# Patient Record
Sex: Female | Born: 1995 | Race: White | Hispanic: No | Marital: Single
Health system: Southern US, Community
[De-identification: ages and names within clinical notes are randomized; demographics above are authoritative.]

---

## 2019-06-04 ENCOUNTER — Other Ambulatory Visit: Payer: Self-pay | Admitting: Specialist

## 2019-06-04 DIAGNOSIS — N63 Unspecified lump in unspecified breast: Secondary | ICD-10-CM

## 2019-06-21 ENCOUNTER — Other Ambulatory Visit: Payer: Self-pay

## 2019-06-21 ENCOUNTER — Ambulatory Visit
Admission: RE | Admit: 2019-06-21 | Discharge: 2019-06-21 | Disposition: A | Discharge status: Home / Self Care | Payer: Managed Care, Other (non HMO) | Source: Ambulatory Visit | Attending: Specialist | Admitting: Specialist

## 2019-06-21 ENCOUNTER — Other Ambulatory Visit: Payer: Self-pay | Admitting: Specialist

## 2019-06-21 DIAGNOSIS — N63 Unspecified lump in unspecified breast: Secondary | ICD-10-CM

## 2019-12-21 ENCOUNTER — Inpatient Hospital Stay: Admission: RE | Admit: 2019-12-21 | Payer: Managed Care, Other (non HMO) | Source: Ambulatory Visit

## 2020-02-23 ENCOUNTER — Ambulatory Visit (INDEPENDENT_AMBULATORY_CARE_PROVIDER_SITE_OTHER): Payer: Managed Care, Other (non HMO) | Admitting: Podiatry

## 2020-02-23 ENCOUNTER — Encounter: Payer: Self-pay | Admitting: Podiatry

## 2020-02-23 ENCOUNTER — Other Ambulatory Visit: Payer: Self-pay

## 2020-02-23 ENCOUNTER — Ambulatory Visit (INDEPENDENT_AMBULATORY_CARE_PROVIDER_SITE_OTHER): Payer: Managed Care, Other (non HMO)

## 2020-02-23 DIAGNOSIS — M6740 Ganglion, unspecified site: Secondary | ICD-10-CM

## 2020-02-23 NOTE — Progress Notes (Signed)
   HPI: 24 y.o. female presenting today as a new patient for evaluation of a bump that developed to the outside of the patient's left foot.  Patient states that is been present for approximately 3 weeks.  She denies any injury to the area.  It is only tender with pressure and in certain shoes.  She has not done anything for treatment.  She presents for further treatment and evaluation  History reviewed. No pertinent past medical history.   Physical Exam: General: The patient is alert and oriented x3 in no acute distress.  Dermatology: Skin is warm, dry and supple bilateral lower extremities. Negative for open lesions or macerations.  Vascular: Palpable pedal pulses bilaterally. No edema or erythema noted. Capillary refill within normal limits.  Neurological: Epicritic and protective threshold grossly intact bilaterally.   Musculoskeletal Exam: Range of motion within normal limits to all pedal and ankle joints bilateral. Muscle strength 5/5 in all groups bilateral.  There is a nonadherent fluctuant mass noted to the lateral aspect of the left forefoot consistent with findings of a ganglion cyst  Assessment: 1.  Ganglion cyst left foot   Plan of Care:  1. Patient evaluated. 2.  Direct pressure was applied to the ganglion cyst and the cyst was ruptured and the fluid was resorbed into the soft tissue. 3.  Recommend pressure and massage daily to the area to prevent recurrence 4.  Return to clinic as needed      Felecia Shelling, DPM [facility]  Dr. Felecia Shelling, DPM    [address] [place], [place] [address]                Office [phone]  Fax [phone]

## 2021-03-08 IMAGING — US US BREAST*L* LIMITED INC AXILLA
1 series · 7 of 7 positions shown · non-contrast
Comparison: None.

CLINICAL DATA: 23-year-old female complaining of a palpable
abnormality in the left breast. Patient states she initially felt
the mass in Friday December, 2018. She states that it is clinically stable.

EXAM:
ULTRASOUND OF THE LEFT BREAST

[Series 1: us breast*left* limited inc axilla · 0.06mm/px · 7 of 7 slices shown]
[im 1/7]
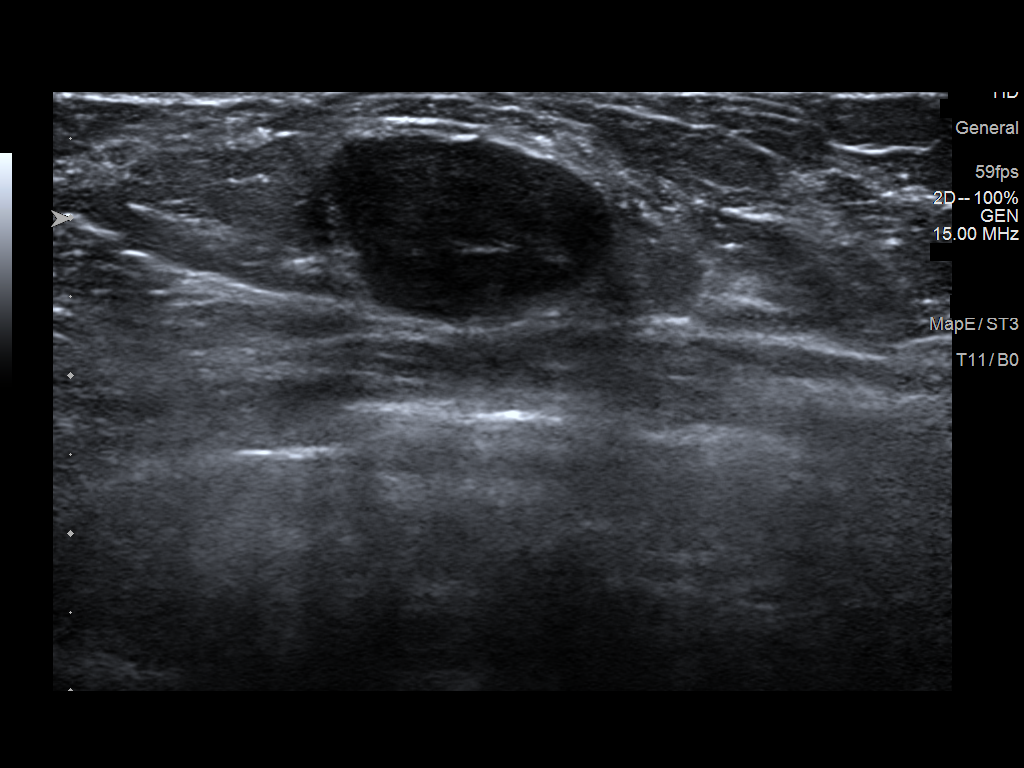
[im 2/7]
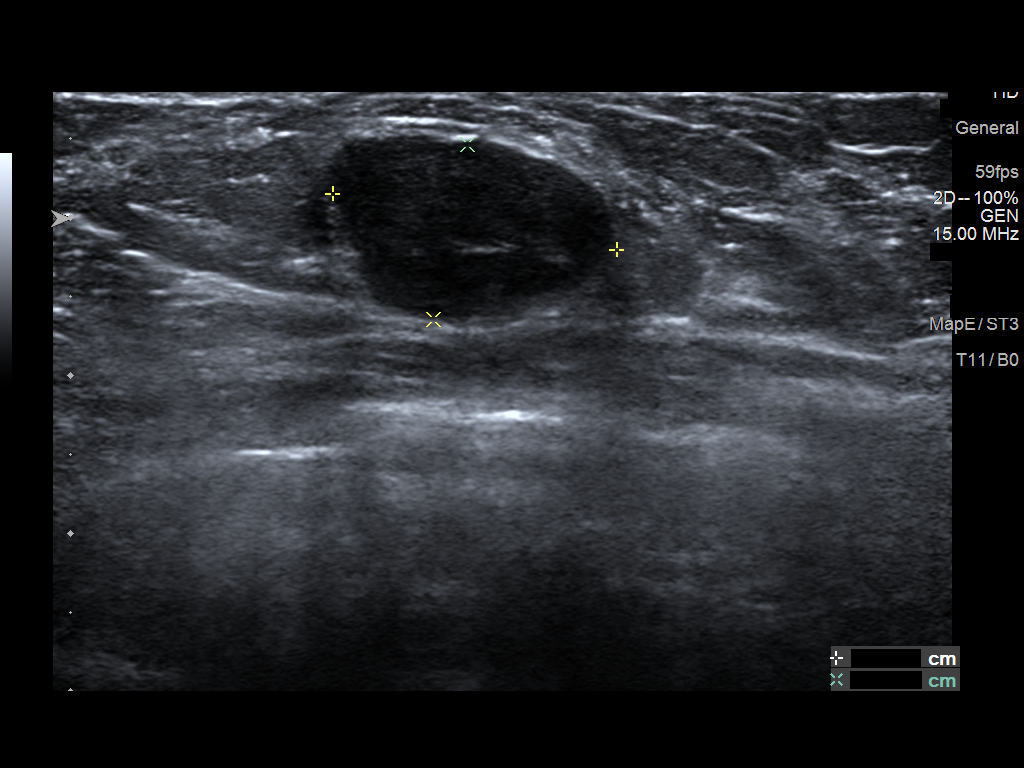
[im 3/7]
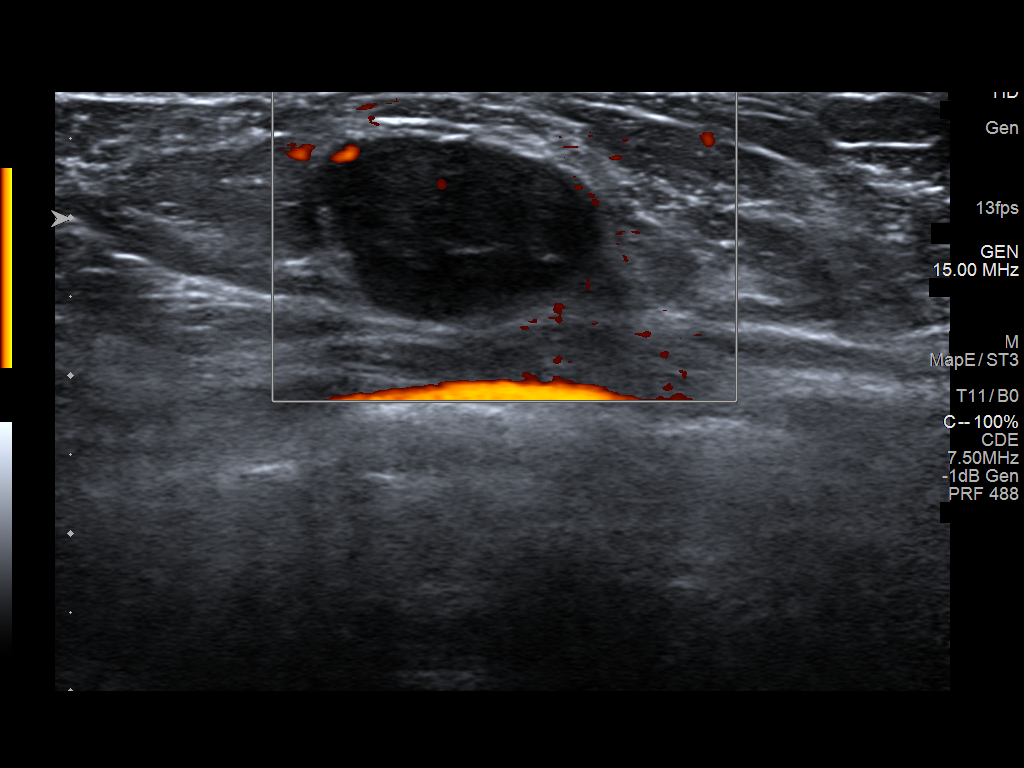
[im 4/7]
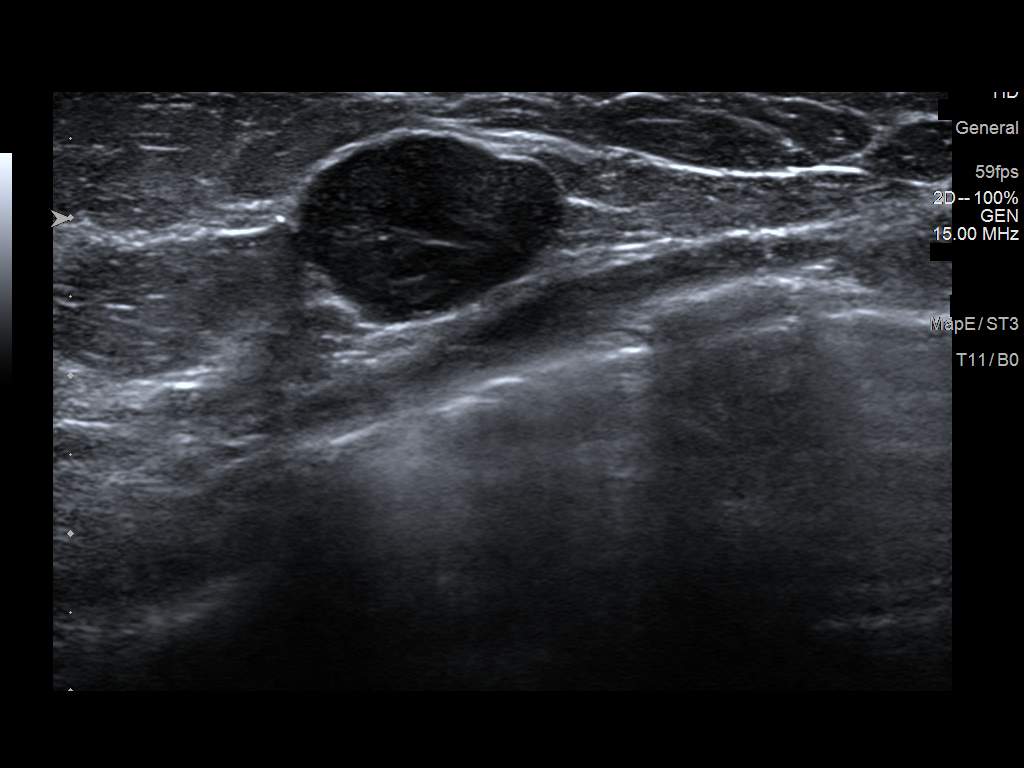
[im 5/7]
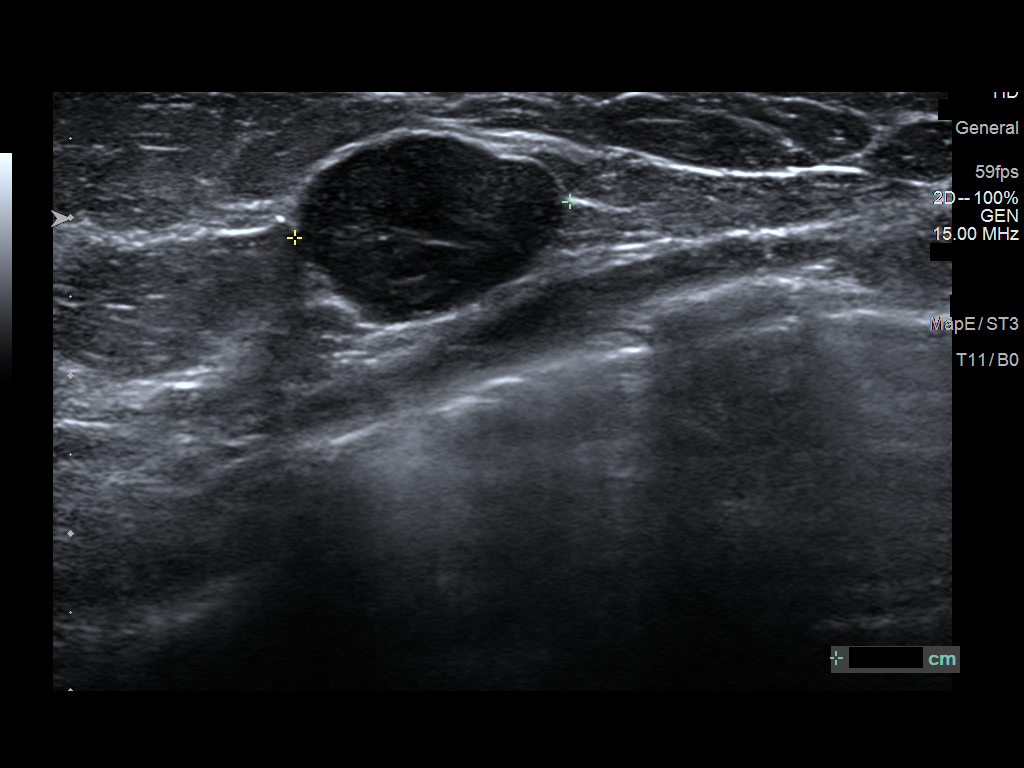
[im 6/7]
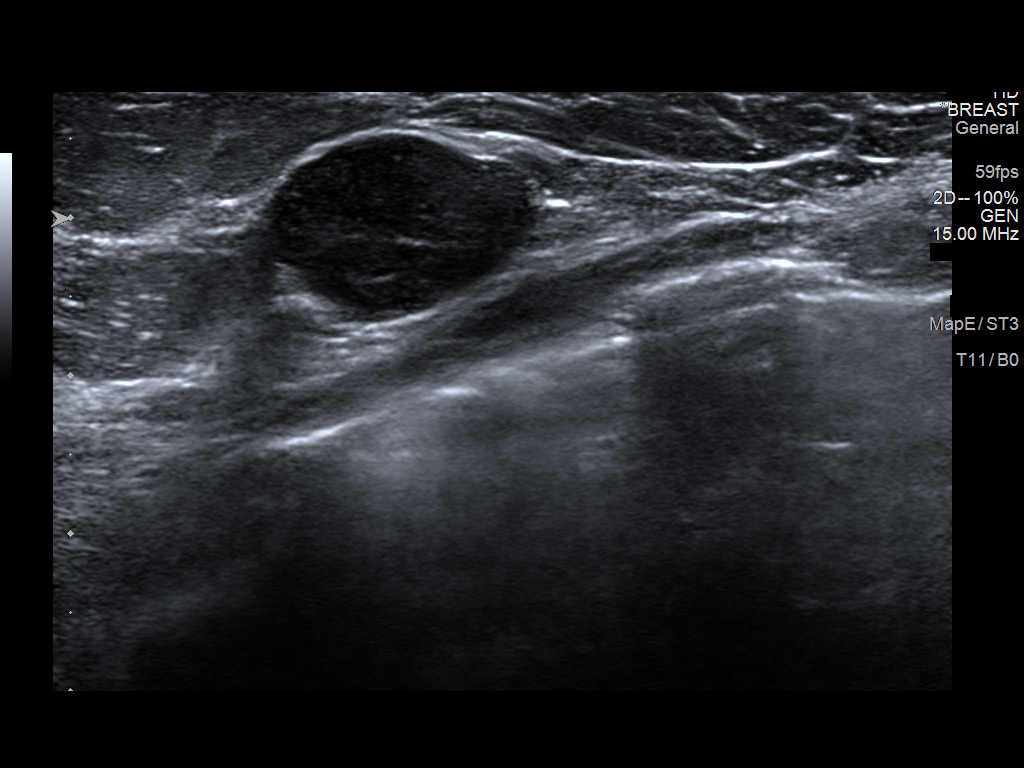
[im 7/7]
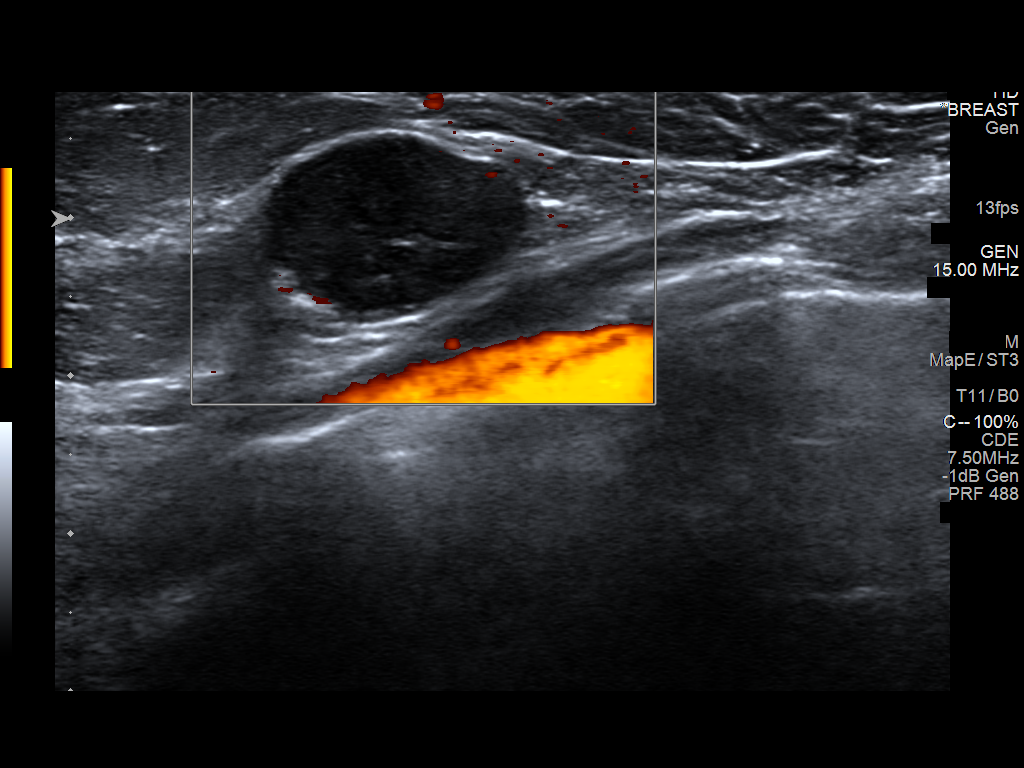

[7 of 7 positions shown; findings below may reference images not displayed]

FINDINGS: On physical exam, I palpate a discrete, freely mobile mass in left
breast at 4 o'clock 7 cm from the nipple.

Targeted ultrasound is performed, showing a well-circumscribed
hypoechoic mass with increased through transmission in the left
breast at 4 o'clock 7 cm from the nipple measuring 1.8 x 1.1 x
cm.
IMPRESSION: Probable fibroadenoma in the left breast.

RECOMMENDATION:
Short-term interval follow-up ultrasound in 6 months,
ultrasound-guided core biopsy and surgical excision were discussed
with the patient. The patient feels comfortable with a short-term
interval follow-up left breast ultrasound in 6 months. The
importance of self-breast examination was discussed with the
patient.

I have discussed the findings and recommendations with the patient.
If applicable, a reminder letter will be sent to the patient
regarding the next appointment.

BI-RADS CATEGORY  3: Probably benign.

## 2021-09-12 ENCOUNTER — Other Ambulatory Visit: Payer: Self-pay | Admitting: Obstetrics and Gynecology

## 2021-09-12 ENCOUNTER — Other Ambulatory Visit: Payer: Self-pay | Admitting: Physical Medicine and Rehabilitation

## 2021-09-12 DIAGNOSIS — N63 Unspecified lump in unspecified breast: Secondary | ICD-10-CM

## 2021-10-02 ENCOUNTER — Other Ambulatory Visit: Payer: Managed Care, Other (non HMO)

## 2021-10-15 ENCOUNTER — Other Ambulatory Visit: Payer: Managed Care, Other (non HMO)

## 2021-11-06 ENCOUNTER — Ambulatory Visit
Admission: RE | Admit: 2021-11-06 | Discharge: 2021-11-06 | Disposition: A | Discharge status: Home / Self Care | Payer: BC Managed Care – PPO | Source: Ambulatory Visit | Attending: Obstetrics and Gynecology | Admitting: Obstetrics and Gynecology

## 2021-11-06 DIAGNOSIS — N63 Unspecified lump in unspecified breast: Secondary | ICD-10-CM

## 2023-07-25 IMAGING — US US BREAST*L* LIMITED INC AXILLA
1 series · 5 of 5 positions shown · non-contrast
Comparison: Prior films

CLINICAL DATA: Short-term follow-up left breast mass

EXAM:
ULTRASOUND OF THE left BREAST

[Series 1: us breast*left* limited inc axilla · 0.07mm/px · 5 of 5 slices shown]
[im 1/5]
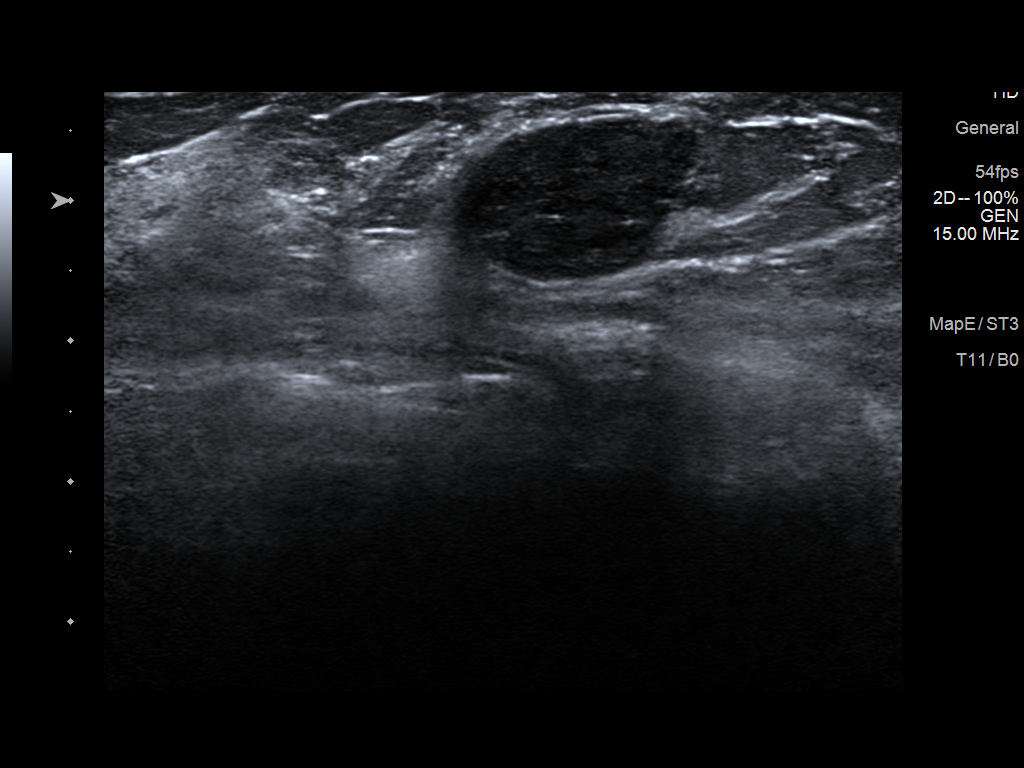
[im 2/5]
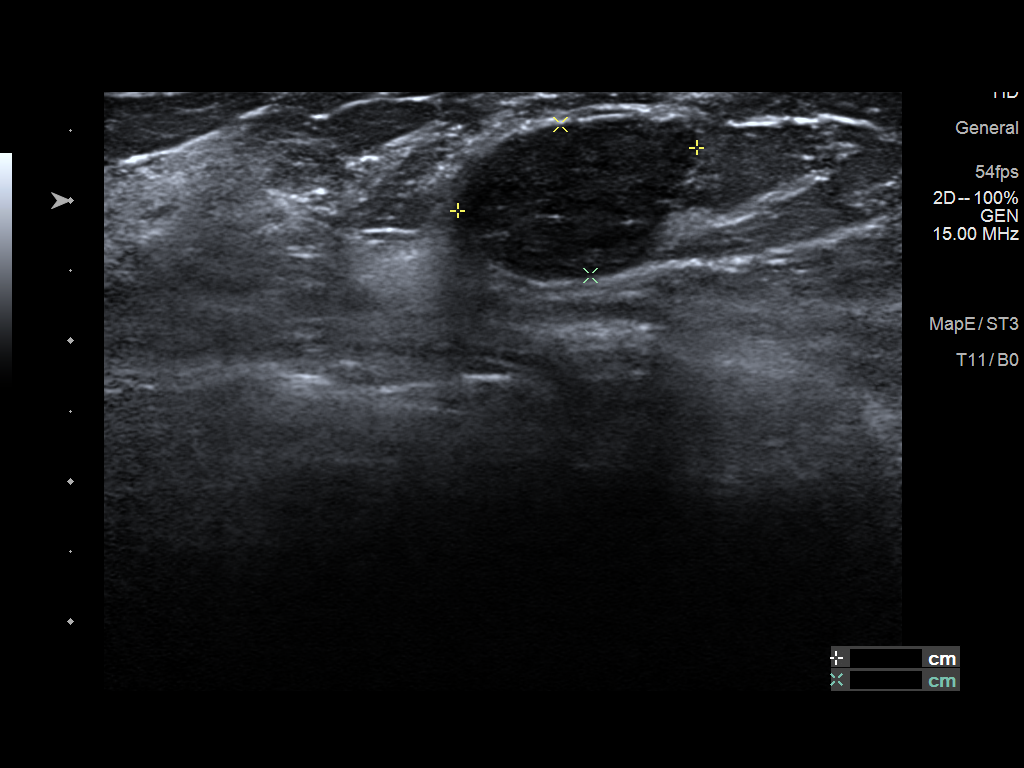
[im 3/5]
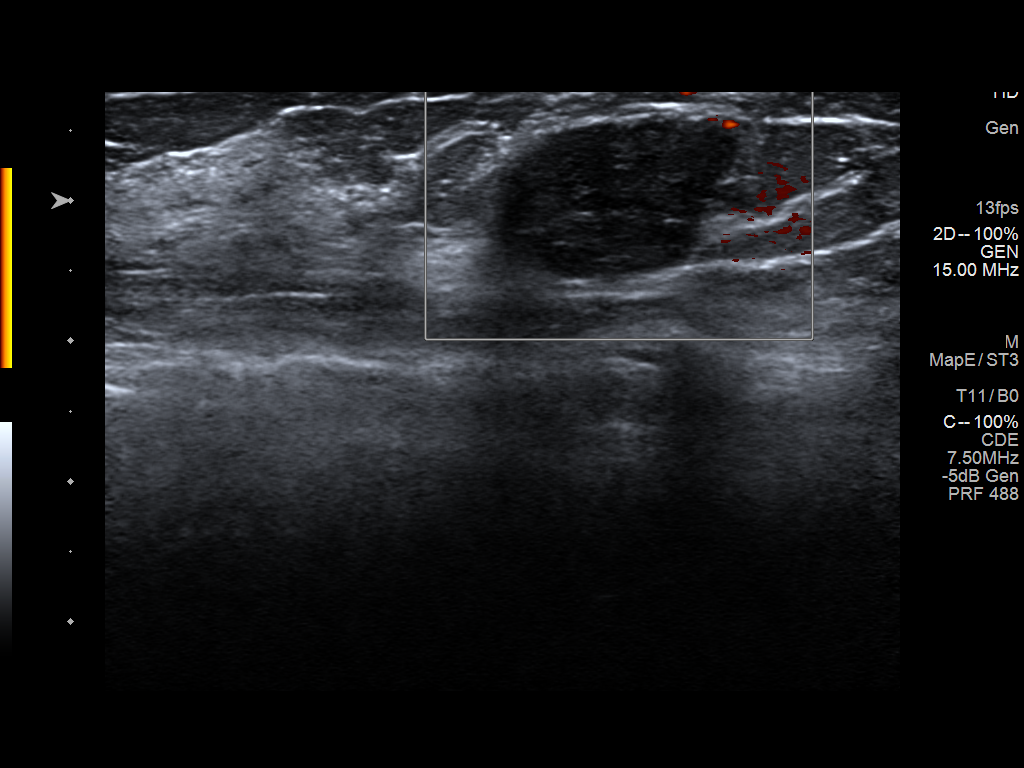
[im 4/5]
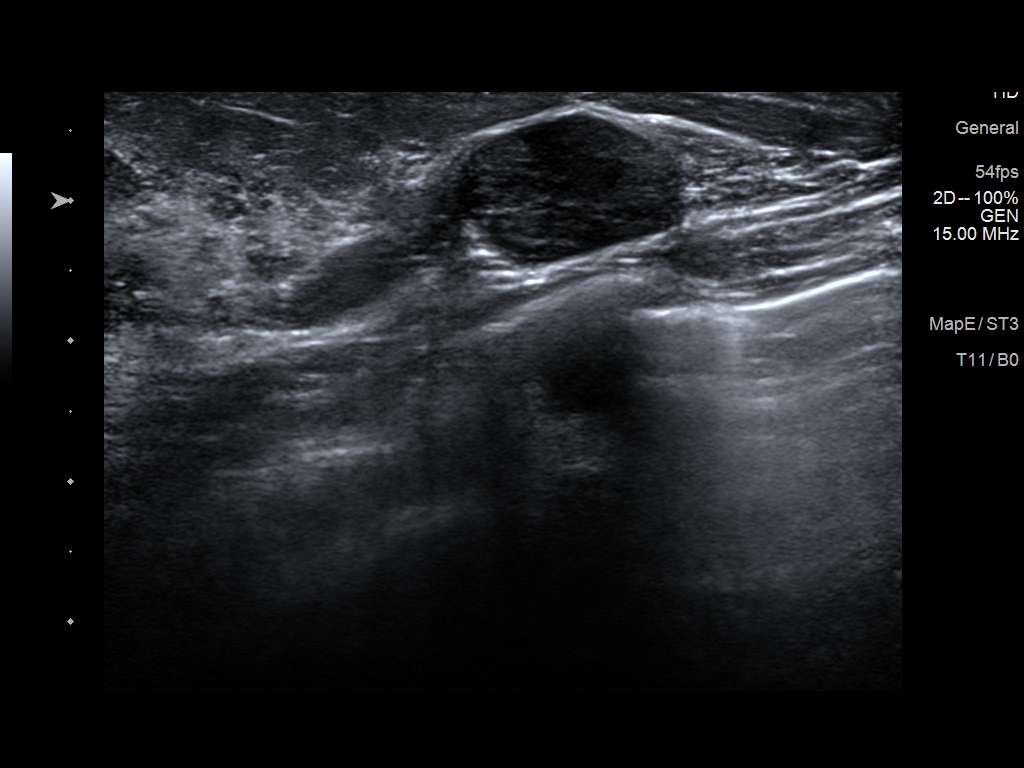
[im 5/5]
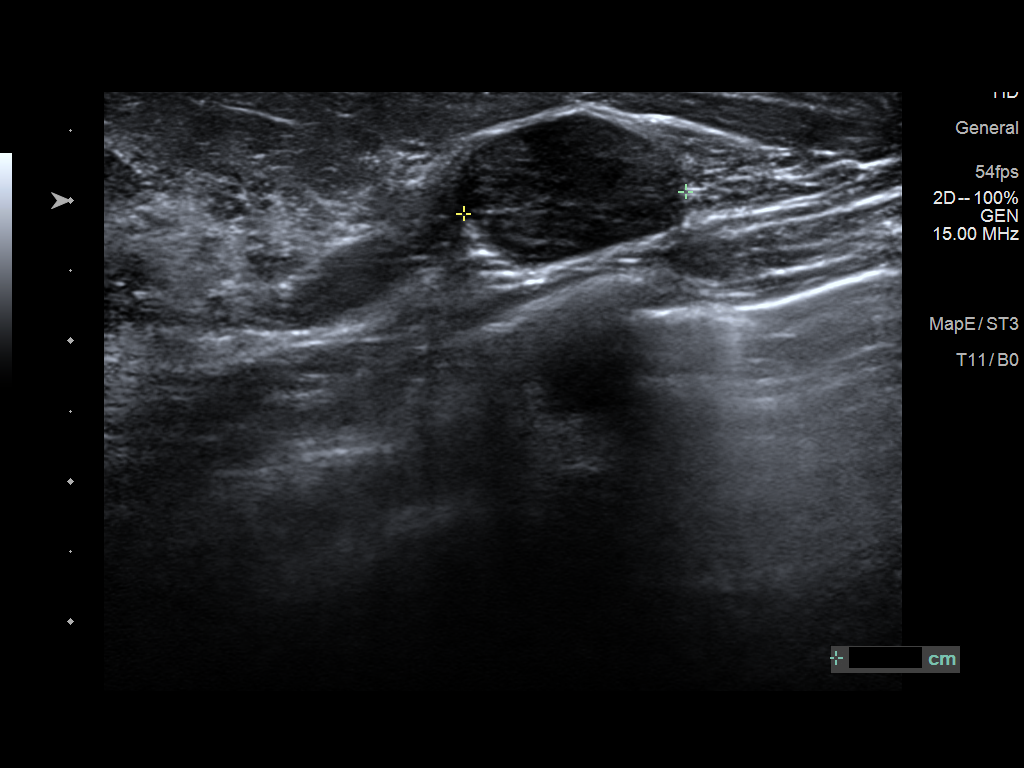

[5 of 5 positions shown; findings below may reference images not displayed]

FINDINGS: Targeted ultrasound is performed, showing 1.8 x 1.1 x 1.6 cm oval
hypoechoic mass at the left breast 4 o'clock 7 cm from nipple
unchanged compared prior ultrasound. This is consistent with a
fibroadenoma. Two years stability is established.
IMPRESSION: Benign findings.

RECOMMENDATION:
Routine screening mammogram at age 40.

I have discussed the findings and recommendations with the patient.
If applicable, a reminder letter will be sent to the patient
regarding the next appointment.

BI-RADS CATEGORY  2: Benign.
